# Patient Record
Sex: Male | Born: 1995 | Race: White | Hispanic: No | Marital: Single | Smoking: Never smoker
Health system: Southern US, Community
[De-identification: ages and names within clinical notes are randomized; demographics above are authoritative.]

## PROBLEM LIST (undated history)

## (undated) DIAGNOSIS — R5600 Simple febrile convulsions: Secondary | ICD-10-CM

## (undated) DIAGNOSIS — J353 Hypertrophy of tonsils with hypertrophy of adenoids: Secondary | ICD-10-CM

## (undated) DIAGNOSIS — H9192 Unspecified hearing loss, left ear: Secondary | ICD-10-CM

## (undated) DIAGNOSIS — I493 Ventricular premature depolarization: Secondary | ICD-10-CM

---
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
## 2017-08-22 ENCOUNTER — Other Ambulatory Visit: Payer: Self-pay

## 2017-08-22 ENCOUNTER — Encounter (HOSPITAL_COMMUNITY): Payer: Self-pay | Admitting: Emergency Medicine

## 2017-08-22 ENCOUNTER — Emergency Department (HOSPITAL_COMMUNITY)
Admission: EM | Admit: 2017-08-22 | Discharge: 2017-08-23 | Disposition: A | Discharge status: Home / Self Care | Payer: BLUE CROSS/BLUE SHIELD | Attending: Emergency Medicine | Admitting: Emergency Medicine

## 2017-08-22 DIAGNOSIS — R45851 Suicidal ideations: Secondary | ICD-10-CM | POA: Diagnosis not present

## 2017-08-22 DIAGNOSIS — F4321 Adjustment disorder with depressed mood: Secondary | ICD-10-CM | POA: Diagnosis not present

## 2017-08-22 DIAGNOSIS — F321 Major depressive disorder, single episode, moderate: Secondary | ICD-10-CM | POA: Insufficient documentation

## 2017-08-22 LAB — COMPREHENSIVE METABOLIC PANEL
ALBUMIN: 4.4 g/dL (ref 3.5–5.0)
ALT: 22 U/L (ref 0–44)
ANION GAP: 9 (ref 5–15)
AST: 24 U/L (ref 15–41)
Alkaline Phosphatase: 75 U/L (ref 38–126)
BILIRUBIN TOTAL: 0.5 mg/dL (ref 0.3–1.2)
BUN: 16 mg/dL (ref 6–20)
CO2: 29 mmol/L (ref 22–32)
Calcium: 9.6 mg/dL (ref 8.9–10.3)
Chloride: 106 mmol/L (ref 98–111)
Creatinine, Ser: 0.81 mg/dL (ref 0.61–1.24)
GFR calc Af Amer: >60 mL/min (ref >60)
GFR calc non Af Amer: >60 mL/min (ref >60)
GLUCOSE: 103 mg/dL — AB (ref 70–99)
POTASSIUM: 4 mmol/L (ref 3.5–5.1)
Sodium: 144 mmol/L (ref 135–145)
TOTAL PROTEIN: 7.5 g/dL (ref 6.5–8.1)

## 2017-08-22 LAB — RAPID URINE DRUG SCREEN, HOSP PERFORMED
Amphetamines: NOT DETECTED
BENZODIAZEPINES: NOT DETECTED
Barbiturates: NOT DETECTED
COCAINE: NOT DETECTED
OPIATES: NOT DETECTED
Tetrahydrocannabinol: NOT DETECTED

## 2017-08-22 LAB — CBC
HEMATOCRIT: 44.6 % (ref 39.0–52.0)
Hemoglobin: 15.1 g/dL (ref 13.0–17.0)
MCH: 30.7 pg (ref 26.0–34.0)
MCHC: 33.9 g/dL (ref 30.0–36.0)
MCV: 90.7 fL (ref 78.0–100.0)
PLATELETS: 348 10*3/uL (ref 150–400)
RBC: 4.92 MIL/uL (ref 4.22–5.81)
RDW: 12.8 % (ref 11.5–15.5)
WBC: 8 10*3/uL (ref 4.0–10.5)

## 2017-08-22 LAB — SALICYLATE LEVEL: Salicylate Lvl: <7 mg/dL (ref 2.8–30.0)

## 2017-08-22 LAB — ETHANOL: Alcohol, Ethyl (B): <10 mg/dL (ref <10)

## 2017-08-22 LAB — ACETAMINOPHEN LEVEL

## 2017-08-22 NOTE — ED Notes (Signed)
Bed: [facility] Expected date:  Expected time:  Means of arrival:  Comments: 
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
## 2017-08-22 NOTE — ED Triage Notes (Signed)
PT reports getting into argument with girlfriend tonight and then reported suicidal thoughts without plan. Pt did report to have gun at residence. Pt presented by [employer] voluntarily.

## 2017-08-22 NOTE — ED Provider Notes (Addendum)
[facility] DEPT Provider Note   CSN: [id number] Arrival date & time: 08/22/17  1920     History   Chief Complaint Chief Complaint  Patient presents with  . Suicidal    HPI Donald Whitehead is a 22 y.o. male.  Patient is a healthy 22 year old male with no significant past medical history who is presenting today with some passive suicidal thoughts.  Patient states there is been a lot going on recently and tonight was just what pushed him over.  He was having a fight with his girlfriend and stated that he was not in a very good place.  She then stated does not mean you are getting kill yourself and he did not deny it.  Currently he said's he probably should not have said that and he does not really feel like he is going to hurt himself.  He denies ever attempting suicide in the past but he does own a gun.  He is never been treated for any type of psychiatric illness in the past.  He takes no medications for depression or anxiety.  The history is provided by the patient.    Past Medical History:  Diagnosis Date  . Adenotonsillar hypertrophy 07/2011   mother denies snoring/apnea/waking up coughing or choking  . Deafness in left ear   . Febrile seizure ([place])    x 2 - as a child  . PVC's (premature ventricular contractions)    followed by [facility]    There are no active problems to display for this patient.   Past Surgical History:  Procedure Laterality Date  . TONSILLECTOMY AND ADENOIDECTOMY  08/13/2011   Procedure: TONSILLECTOMY AND ADENOIDECTOMY;  Surgeon: Fannie Knee, MD;  Location: [facility];  Service: ENT;  Laterality: Bilateral;        Home Medications    Prior to Admission medications   Medication Sig Start Date End Date Taking? Authorizing Provider  Adapalene-Benzoyl Peroxide (EPIDUO EX) Apply topically.    [provider]  Doxycycline Hyclate (ACTICLATE PO) Take by mouth.    [provider]    Family History Family History  Problem Relation Age of Onset  . Cancer Maternal Grandmother   . Hypertension Mother   . Hypertension Paternal Uncle   . Diabetes Maternal Grandfather   . Hypertension Maternal Grandfather   . Heart disease Maternal Grandfather     Social History Social History   Tobacco Use  . Smoking status: Never Smoker  . Smokeless tobacco: Never Used  Substance Use Topics  . Alcohol use: No  . Drug use: No     Allergies   Patient has no known allergies.   Review of Systems Review of Systems  All other systems reviewed and are negative.    Physical Exam Updated Vital Signs BP (!) 135/91 (BP Location: Left Arm)   Pulse 84   Temp 99 F (37.2 C) (Oral)   Resp 18   Ht 5\' 9"  (1.753 m)   Wt 59 kg (130 lb)   SpO2 99%   BMI 19.20 kg/m   Physical Exam  Constitutional: He is oriented to person, place, and time. He appears well-developed and well-nourished. No distress.  HENT:  Head: Normocephalic and atraumatic.  Mouth/Throat: Oropharynx is clear and moist.  Eyes: Pupils are equal, round, and reactive to light. Conjunctivae and EOM are normal.  Neck: Normal range of motion. Neck supple.  Cardiovascular: Normal rate, regular rhythm and intact distal pulses.  No murmur heard. Pulmonary/Chest: Effort normal and breath sounds normal. No respiratory distress. He has no wheezes. He has no rales.  Abdominal: Soft. He exhibits no distension. There is no tenderness. There is no rebound and no guarding.  Musculoskeletal: Normal range of motion. He exhibits no edema or tenderness.  Neurological: He is alert and oriented to person, place, and time.  Skin: Skin is warm and dry. No rash noted. No erythema.  Psychiatric: He has a normal mood and affect. His behavior is normal. Thought content is not paranoid and not delusional. He expresses no homicidal and no suicidal ideation. He expresses no suicidal plans and no homicidal plans.    Currently patient is denying any suicidal or homicidal thoughts  Nursing note and vitals reviewed.    ED Treatments / Results  Labs (all labs ordered are listed, but only abnormal results are displayed) Labs Reviewed  COMPREHENSIVE METABOLIC PANEL - Abnormal; Notable for the following components:      Result Value   Glucose, Bld 103 (*)    All other components within normal limits  ACETAMINOPHEN LEVEL - Abnormal; Notable for the following components:   Acetaminophen (Tylenol), Serum <10 (*)    All other components within normal limits  ETHANOL  SALICYLATE LEVEL  CBC  RAPID URINE DRUG SCREEN, HOSP PERFORMED    EKG None  Radiology No results found.  Procedures Procedures (including critical care time)  Medications Ordered in ED Medications - No data to display   Initial Impression / Assessment and Plan / ED Course  I have reviewed the triage vital signs and the nursing notes.  Pertinent labs & imaging results that were available during my care of the patient were reviewed by me and considered in my medical decision making (see chart for details).     Presenting voluntarily today after a fight with his girlfriend where he expressed some suicidal tendency.  He had no plan but does own a gun.  He is never had prior attempts or history of mental illness.  He does not abuse drugs or alcohol.  He otherwise has no medical history.  He is medically clear at this time.  Labs are pending. We will have TTS evaluate.  12:00 AM Labs wnl.  She was evaluated by TTS and at this time they feel that he needs to stay overnight for reassessment by psychiatry in the morning and this will also give him time to discuss with patient's mother to ensure that the gun can be secured.  Final Clinical Impressions(s) / ED Diagnoses   Final diagnoses:  Suicidal ideation    ED Discharge Orders    None       Blanchie Dessert, MD 08/22/17 [place]    Blanchie Dessert, MD 08/23/17 0000

## 2017-08-22 NOTE — ED Notes (Signed)
SBAR Report received from previous nurse. Pt received calm and visible on unit. Pt denies current SI/ HI, A/V H, or pain at this time, and appears otherwise stable and free of distress Pt endorses anxiety 6/10 and depression 6/10. Pt reminded of camera surveillance, q 15 min rounds, and rules of the milieu. Will continue to assess.

## 2017-08-22 NOTE — ED Notes (Signed)
Bed: [facility] Expected date:  Expected time:  Means of arrival:  Comments: [employer]

## 2017-08-23 DIAGNOSIS — F4321 Adjustment disorder with depressed mood: Secondary | ICD-10-CM | POA: Diagnosis present
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
## 2017-08-23 NOTE — Discharge Instructions (Signed)
For your behavioral health needs, you are advised to follow up with an outpatient therapist.  You are scheduled for an intake appointment with Donald Whitehead at the [facility] at [place].  The appointment is scheduled for Wednesday, August 28,2019 at 5:00 pm.  For your first visit, plan to be there 15 minutes early.  Be sure to have your health insurance card, and the completed intake paperwork provided by Emergency Department staff when you arrive:       [facility] at [place]. [address]. [place], [place] [address]      [phone]

## 2017-08-23 NOTE — BH Assessment (Addendum)
[name] Assessment Progress Note  Per Buford Dresser, DO, this pt does not require psychiatric hospitalization at this time.  Pt is to be discharged from [facility] with referral information for an outpatient therapist.  Contact is also to be made with family in order to secure pt's firearm.  This Probation officer spoke to pt.  He agrees for me to call his parents to secure firearm.  He also agrees for me to schedule an intake appointment for him.  At 11:56 I called the [facility] at [place] and spoke to [place].  He has scheduled an intake appointment with Binnie Rail on Wednesday, 8/28.2019 at 17:00.  He is to be advised to arrive 15 minutes early for the initial visit.  This has been included in pt's discharge instructions.  Pt has also been provided with intake paperwork to fill out and take with him to this visit, along with his health insurance card.  Pt's nurse, Diane, has been notified.  At 12:25 I called pt's father, Donald Whitehead ([phone]), who agrees to secure pt's firearm.   Jalene Mullet, [place] Triage Specialist [phone]

## 2017-08-23 NOTE — ED Notes (Signed)
Pt discharged home. Discharged instructions read to pt who verbalized understanding. All belongings returned to pt who signed for same. Denies SI/HI, is not delusional and not responding to internal stimuli. Escorted pt to the ED exit.    

## 2017-09-22 ENCOUNTER — Ambulatory Visit (HOSPITAL_COMMUNITY): Payer: Self-pay | Admitting: Licensed Clinical Social Worker
